# Patient Record
Sex: Female | Born: 1980 | Race: White | Hispanic: No | Marital: Single | Smoking: Current every day smoker
Health system: Southern US, Community
[De-identification: ages and names within clinical notes are randomized; demographics above are authoritative.]

## PROBLEM LIST (undated history)

## (undated) DIAGNOSIS — R569 Unspecified convulsions: Secondary | ICD-10-CM

## (undated) HISTORY — PX: TONSILLECTOMY: SUR1361

---

## 2004-02-15 ENCOUNTER — Emergency Department: Payer: Self-pay | Admitting: Emergency Medicine

## 2004-07-10 ENCOUNTER — Emergency Department: Payer: Self-pay | Admitting: Unknown Physician Specialty

## 2005-06-20 ENCOUNTER — Emergency Department: Payer: Self-pay | Admitting: Emergency Medicine

## 2005-08-28 ENCOUNTER — Ambulatory Visit: Payer: Self-pay | Admitting: Unknown Physician Specialty

## 2005-12-13 ENCOUNTER — Ambulatory Visit: Payer: Self-pay

## 2005-12-17 ENCOUNTER — Ambulatory Visit: Payer: Self-pay

## 2008-08-20 ENCOUNTER — Emergency Department: Payer: Self-pay | Admitting: Emergency Medicine

## 2011-12-04 ENCOUNTER — Emergency Department: Payer: Self-pay | Admitting: Emergency Medicine

## 2011-12-04 LAB — COMPREHENSIVE METABOLIC PANEL
Alkaline Phosphatase: 75 U/L (ref 50–136)
Anion Gap: 8 (ref 7–16)
BUN: 16 mg/dL (ref 7–18)
Bilirubin,Total: 0.1 mg/dL — ABNORMAL LOW (ref 0.2–1.0)
Calcium, Total: 8.7 mg/dL (ref 8.5–10.1)
Creatinine: 0.73 mg/dL (ref 0.60–1.30)
EGFR (African American): >60
EGFR (Non-African Amer.): >60
Glucose: 87 mg/dL (ref 65–99)
SGPT (ALT): 20 U/L (ref 12–78)
Sodium: 138 mmol/L (ref 136–145)
Total Protein: 7.3 g/dL (ref 6.4–8.2)

## 2011-12-04 LAB — CBC
MCV: 95 fL (ref 80–100)
Platelet: 239 10*3/uL (ref 150–440)
RDW: 13 % (ref 11.5–14.5)
WBC: 10 10*3/uL (ref 3.6–11.0)

## 2011-12-04 LAB — URINALYSIS, COMPLETE
Bilirubin,UR: NEGATIVE
Ketone: NEGATIVE
Ph: 6 (ref 4.5–8.0)
Protein: NEGATIVE
RBC,UR: 2 /HPF (ref 0–5)
Squamous Epithelial: <1

## 2011-12-04 LAB — PREGNANCY, URINE: Pregnancy Test, Urine: POSITIVE m[IU]/mL

## 2011-12-09 ENCOUNTER — Emergency Department: Payer: Self-pay | Admitting: Emergency Medicine

## 2011-12-09 LAB — URINALYSIS, COMPLETE
Bilirubin,UR: NEGATIVE
Ketone: NEGATIVE
Leukocyte Esterase: NEGATIVE
Nitrite: NEGATIVE
Ph: 5 (ref 4.5–8.0)
Protein: 30
RBC,UR: 3 /HPF (ref 0–5)
Specific Gravity: 1.025 (ref 1.003–1.030)
Squamous Epithelial: 2

## 2011-12-09 LAB — COMPREHENSIVE METABOLIC PANEL
Albumin: 3.6 g/dL (ref 3.4–5.0)
BUN: 13 mg/dL (ref 7–18)
Co2: 24 mmol/L (ref 21–32)
Creatinine: 0.73 mg/dL (ref 0.60–1.30)
EGFR (Non-African Amer.): >60
Glucose: 109 mg/dL — ABNORMAL HIGH (ref 65–99)
Potassium: 3.5 mmol/L (ref 3.5–5.1)
Sodium: 135 mmol/L — ABNORMAL LOW (ref 136–145)
Total Protein: 7.4 g/dL (ref 6.4–8.2)

## 2011-12-09 LAB — CBC
MCHC: 34.8 g/dL (ref 32.0–36.0)
Platelet: 239 10*3/uL (ref 150–440)
RDW: 13.1 % (ref 11.5–14.5)
WBC: 9.8 10*3/uL (ref 3.6–11.0)

## 2011-12-09 LAB — HCG, QUANTITATIVE, PREGNANCY: Beta Hcg, Quant.: 10015 m[IU]/mL — ABNORMAL HIGH

## 2011-12-09 LAB — LIPASE, BLOOD: Lipase: 127 U/L (ref 73–393)

## 2011-12-09 IMAGING — US US OB < 14 WEEKS - US OB TV
1 series · 14 of 28 positions shown · non-contrast
Comparison: none

REASON FOR EXAM: preg pt has epigastric pain but just told med has been
spotting x 2 days as well
COMMENTS:

[Series 1: us ob < 14 weeks - us ob tv · 0.23mm/px · 14 of 36 slices shown]
[im 2/36]
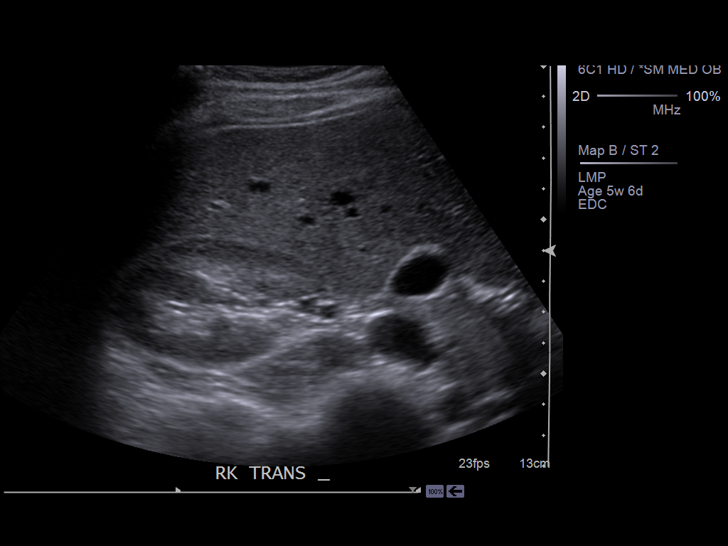
[im 4/36]
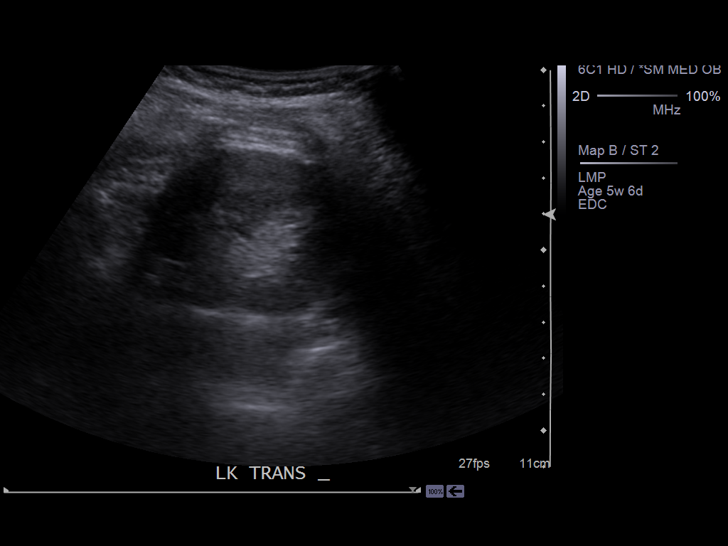
[im 7/36]
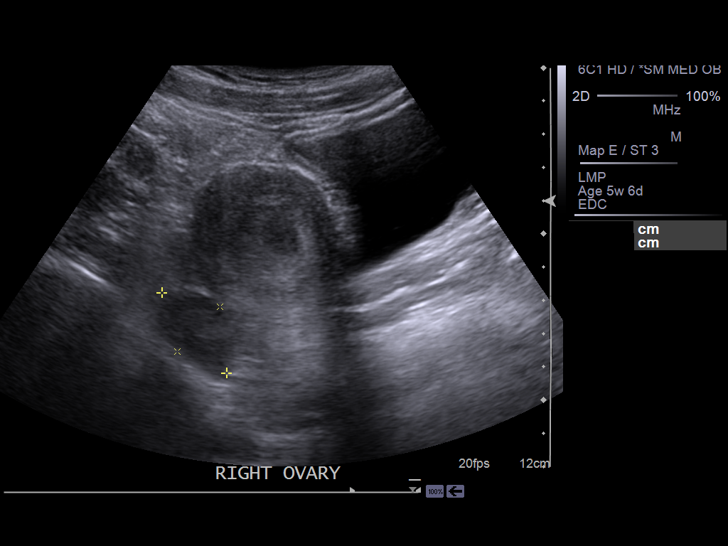
[im 10/36]
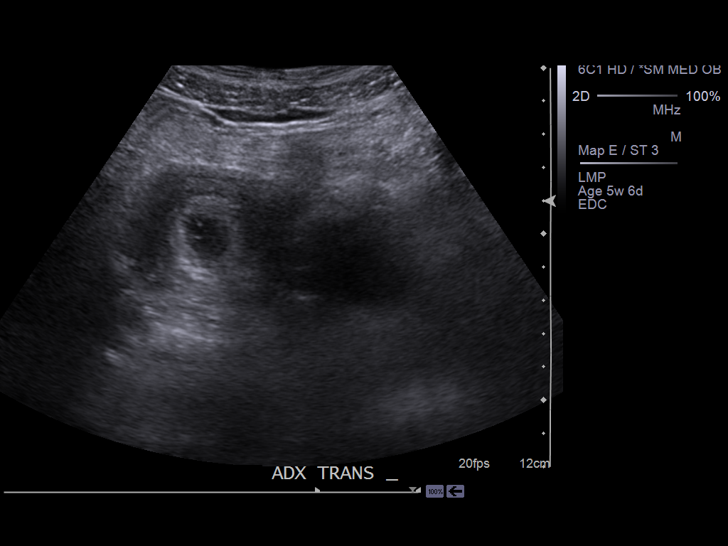
[im 12/36]
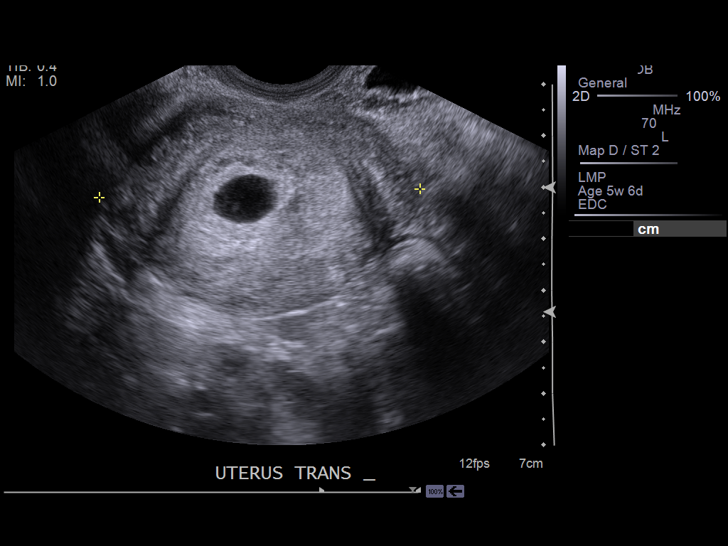
[im 15/36]
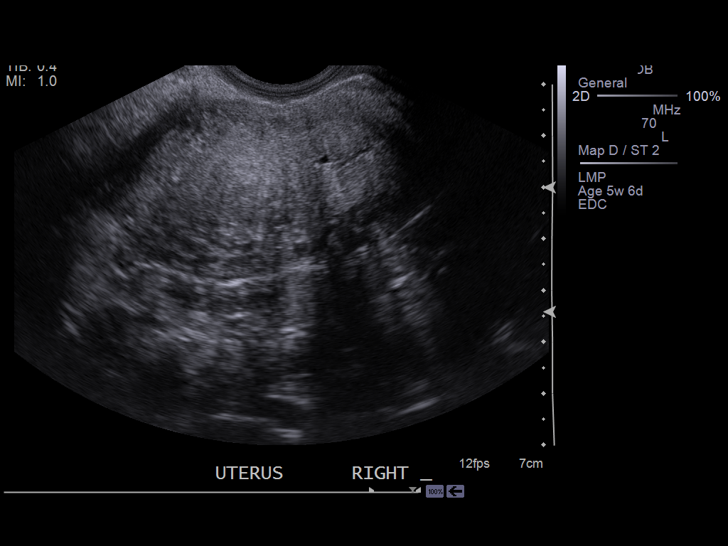
[im 17/36]
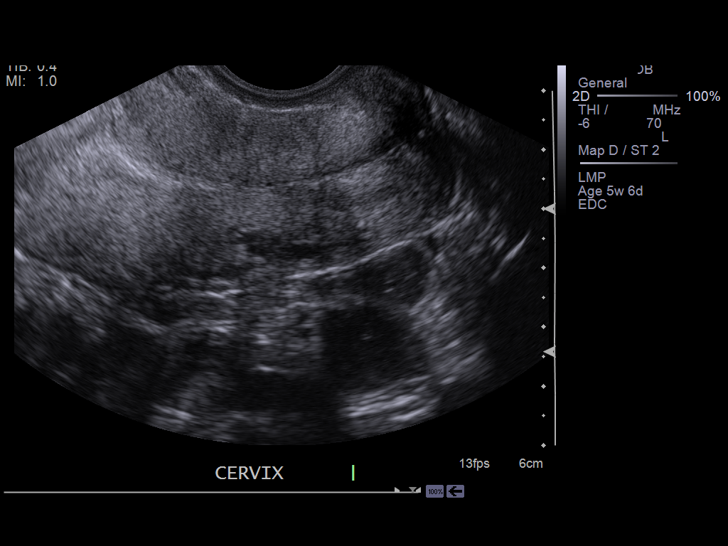
[im 20/36]
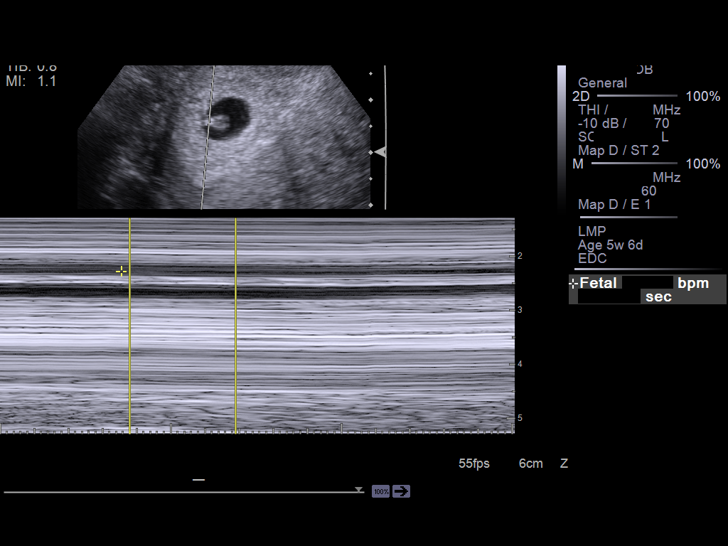
[im 23/36]
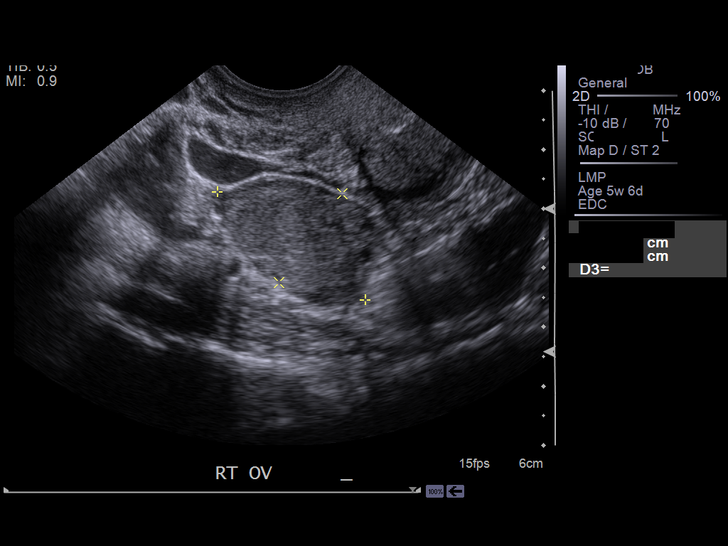
[im 25/36]
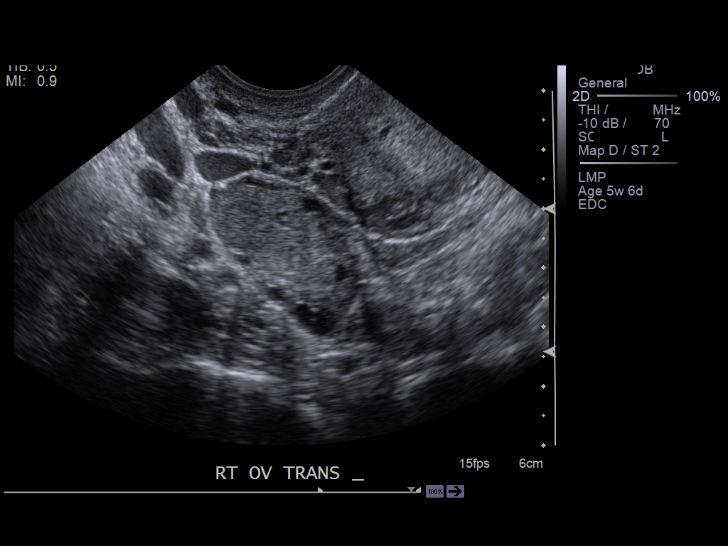
[im 28/36]
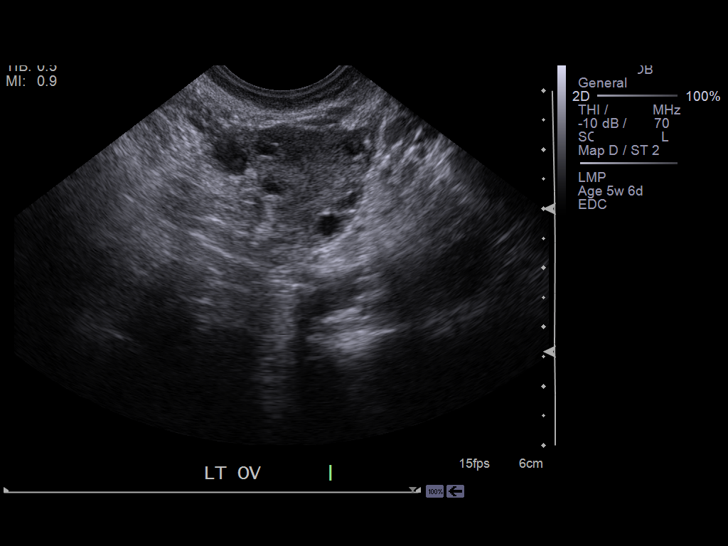
[im 30/36]
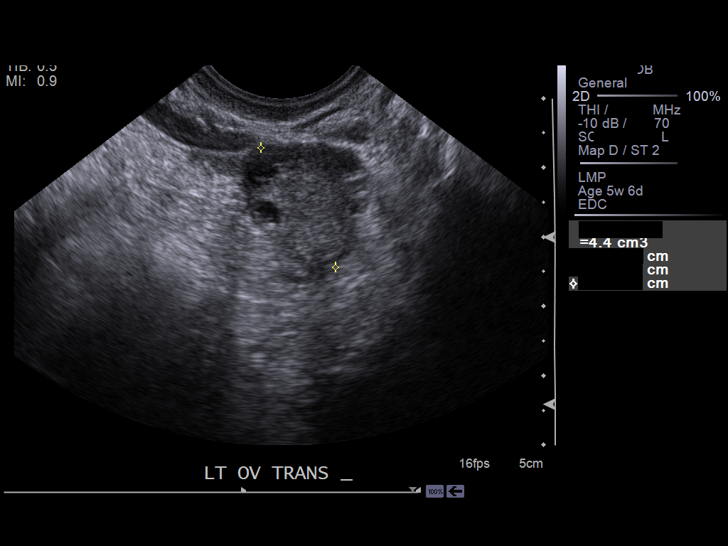
[im 33/36]
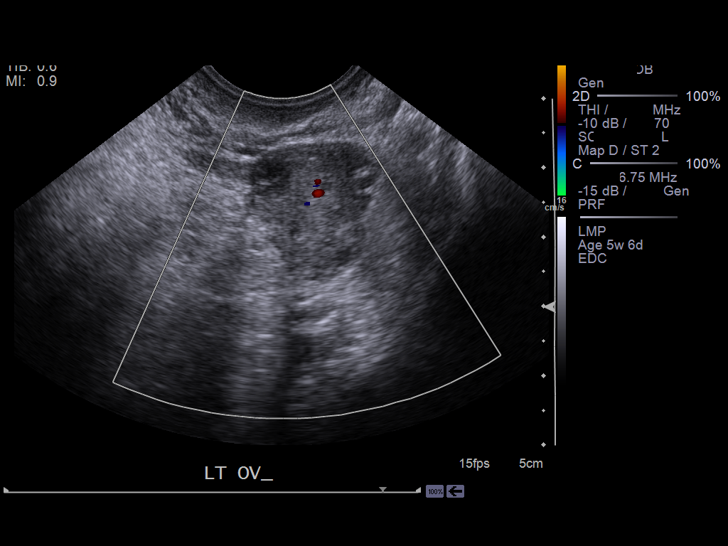
[im 36/36]
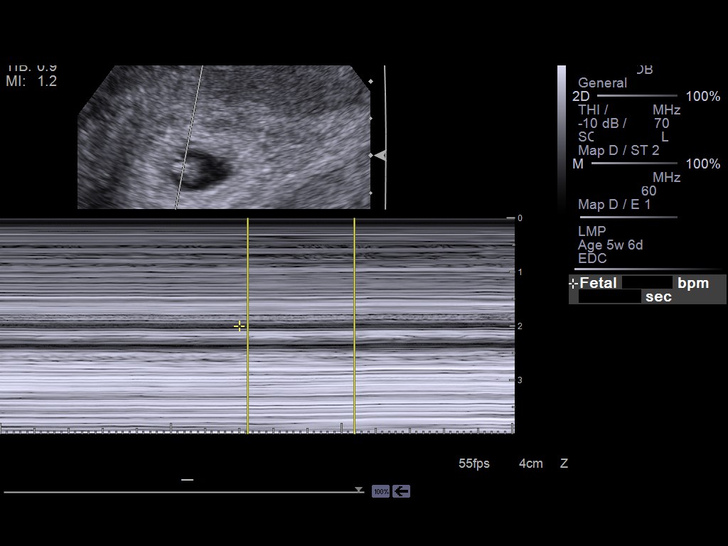

[14 of 28 positions shown; findings below may reference images not displayed]

PROCEDURE:     US  - US OB LESS THAN 14 WEEKS/W TRANS  - [DATE] [DATE]

RESULT:     The patient has a beta-hCG of [DATE]. The uterus exhibits a
gestational sac with a fetal pole exhibiting crown-rump length of 0.3 cm
corresponding to a 5 week 6 day gestation. A fetal cardiac rate of only 96
beats per minute was demonstrated. The maternal ovaries are normal in
echotexture and contour. The right ovary measures 3.1 cm in greatest
dimension and the left ovary 2.4 cm in greatest dimension. Survey views of
the maternal kidneys are normal.
IMPRESSION: There is an early IUP with a somewhat low cardiac rate of
only 96 beats per minute. The estimated gestational age is 5 weeks 6 days
+ / - 4 days. This corresponds to an estimated date of confinement [DATE]. This is in good agreement with the patient's clinical dating.

Followup ultrasound examinations are recommended to reassess the cardiac
rate as well as interval growth.

A preliminary report was sent to the [HOSPITAL] the conclusion
of the study.

[REDACTED]

## 2012-04-15 ENCOUNTER — Observation Stay: Payer: Self-pay | Admitting: Obstetrics & Gynecology

## 2012-04-15 LAB — URINALYSIS, COMPLETE
Bacteria: NONE SEEN
Bilirubin,UR: NEGATIVE
Glucose,UR: NEGATIVE mg/dL (ref 0–75)
Ketone: NEGATIVE
Nitrite: NEGATIVE
Ph: 7 (ref 4.5–8.0)
Protein: NEGATIVE
RBC,UR: 1 /HPF (ref 0–5)

## 2012-05-30 ENCOUNTER — Observation Stay: Payer: Self-pay | Admitting: Obstetrics & Gynecology

## 2012-06-30 ENCOUNTER — Observation Stay: Payer: Self-pay | Admitting: Obstetrics and Gynecology

## 2012-06-30 LAB — URINALYSIS, COMPLETE
Bilirubin,UR: NEGATIVE
Glucose,UR: NEGATIVE mg/dL (ref 0–75)
Ketone: NEGATIVE
Leukocyte Esterase: NEGATIVE
Nitrite: NEGATIVE
Ph: 7 (ref 4.5–8.0)
Protein: NEGATIVE
RBC,UR: <1 /HPF (ref 0–5)
Specific Gravity: 1.004 (ref 1.003–1.030)
Squamous Epithelial: <1

## 2012-07-10 ENCOUNTER — Observation Stay: Payer: Self-pay | Admitting: Obstetrics & Gynecology

## 2012-07-11 ENCOUNTER — Inpatient Hospital Stay: Payer: Self-pay

## 2012-07-11 LAB — URINALYSIS, COMPLETE
Bacteria: NONE SEEN
Bilirubin,UR: NEGATIVE
Nitrite: NEGATIVE
Ph: 7 (ref 4.5–8.0)
RBC,UR: <1 /HPF (ref 0–5)
Specific Gravity: 1.009 (ref 1.003–1.030)
Squamous Epithelial: <1

## 2012-07-11 LAB — DRUG SCREEN, URINE
Amphetamines, Ur Screen: NEGATIVE (ref ?–1000)
Cannabinoid 50 Ng, Ur ~~LOC~~: NEGATIVE (ref ?–50)
Cocaine Metabolite,Ur ~~LOC~~: NEGATIVE (ref ?–300)
Opiate, Ur Screen: NEGATIVE (ref ?–300)
Phencyclidine (PCP) Ur S: NEGATIVE (ref ?–25)
Tricyclic, Ur Screen: NEGATIVE (ref ?–1000)

## 2012-07-11 LAB — CBC WITH DIFFERENTIAL/PLATELET
Basophil #: 0.1 10*3/uL (ref 0.0–0.1)
Eosinophil #: 0.1 10*3/uL (ref 0.0–0.7)
Eosinophil %: 0.3 %
MCH: 30.1 pg (ref 26.0–34.0)
MCV: 93 fL (ref 80–100)
Monocyte #: 1.8 x10 3/mm — ABNORMAL HIGH (ref 0.2–0.9)
Neutrophil %: 87.4 %
RBC: 2.8 10*6/uL — ABNORMAL LOW (ref 3.80–5.20)
RDW: 13 % (ref 11.5–14.5)
WBC: 31.8 10*3/uL — ABNORMAL HIGH (ref 3.6–11.0)

## 2012-07-12 LAB — CBC WITH DIFFERENTIAL/PLATELET
Eosinophil %: 0.3 %
HGB: 8.6 g/dL — ABNORMAL LOW (ref 12.0–16.0)
MCH: 31.7 pg (ref 26.0–34.0)
MCHC: 34.3 g/dL (ref 32.0–36.0)
MCV: 92 fL (ref 80–100)
Monocyte #: 3.1 x10 3/mm — ABNORMAL HIGH (ref 0.2–0.9)
Neutrophil #: 35.5 10*3/uL — ABNORMAL HIGH (ref 1.4–6.5)
Neutrophil %: 83.8 %
RBC: 2.71 10*6/uL — ABNORMAL LOW (ref 3.80–5.20)
WBC: 42 10*3/uL — ABNORMAL HIGH (ref 3.6–11.0)

## 2012-07-12 LAB — BASIC METABOLIC PANEL
Anion Gap: 11 (ref 7–16)
Chloride: 106 mmol/L (ref 98–107)
Co2: 22 mmol/L (ref 21–32)
EGFR (African American): >60
EGFR (Non-African Amer.): >60
Glucose: 74 mg/dL (ref 65–99)
Osmolality: 273 (ref 275–301)
Potassium: 3.2 mmol/L — ABNORMAL LOW (ref 3.5–5.1)

## 2012-07-12 LAB — GENTAMICIN LEVEL, RANDOM: Gentamicin, Random: 5.8 ug/mL

## 2012-07-13 LAB — CBC WITH DIFFERENTIAL/PLATELET
Basophil #: 0.1 10*3/uL (ref 0.0–0.1)
Basophil %: 0.3 %
Eosinophil #: 0.4 10*3/uL (ref 0.0–0.7)
Eosinophil %: 1.8 %
HCT: 20.9 % — ABNORMAL LOW (ref 35.0–47.0)
HGB: 7.2 g/dL — ABNORMAL LOW (ref 12.0–16.0)
Lymphocyte #: 4.1 10*3/uL — ABNORMAL HIGH (ref 1.0–3.6)
Lymphocyte %: 20.4 %
MCH: 32.1 pg (ref 26.0–34.0)
MCHC: 34.3 g/dL (ref 32.0–36.0)
MCV: 94 fL (ref 80–100)
Monocyte #: 1.3 x10 3/mm — ABNORMAL HIGH (ref 0.2–0.9)
Neutrophil #: 14.3 10*3/uL — ABNORMAL HIGH (ref 1.4–6.5)
Neutrophil %: 71 %
Platelet: 187 10*3/uL (ref 150–440)
RDW: 13.7 % (ref 11.5–14.5)
WBC: 20.1 10*3/uL — ABNORMAL HIGH (ref 3.6–11.0)

## 2012-07-13 LAB — URINE CULTURE

## 2012-07-14 LAB — CBC WITH DIFFERENTIAL/PLATELET
Eosinophil #: 0.3 10*3/uL (ref 0.0–0.7)
HCT: 23 % — ABNORMAL LOW (ref 35.0–47.0)
HGB: 7.9 g/dL — ABNORMAL LOW (ref 12.0–16.0)
Lymphocyte #: 3.4 10*3/uL (ref 1.0–3.6)
MCV: 94 fL (ref 80–100)
Monocyte #: 1 x10 3/mm — ABNORMAL HIGH (ref 0.2–0.9)
Neutrophil #: 10.1 10*3/uL — ABNORMAL HIGH (ref 1.4–6.5)
Platelet: 225 10*3/uL (ref 150–440)
RBC: 2.45 10*6/uL — ABNORMAL LOW (ref 3.80–5.20)
RDW: 13.3 % (ref 11.5–14.5)
WBC: 14.9 10*3/uL — ABNORMAL HIGH (ref 3.6–11.0)

## 2012-07-14 LAB — CREATININE, SERUM: EGFR (African American): >60

## 2012-07-15 LAB — CBC WITH DIFFERENTIAL/PLATELET
Eosinophil #: 0.3 10*3/uL (ref 0.0–0.7)
Eosinophil %: 1.8 %
HCT: 25.6 % — ABNORMAL LOW (ref 35.0–47.0)
Lymphocyte %: 22.3 %
MCH: 32.5 pg (ref 26.0–34.0)
MCV: 94 fL (ref 80–100)
Monocyte #: 1.1 x10 3/mm — ABNORMAL HIGH (ref 0.2–0.9)
Monocyte %: 7.5 %
Neutrophil %: 67.9 %
Platelet: 269 10*3/uL (ref 150–440)
RDW: 13.4 % (ref 11.5–14.5)

## 2012-07-15 LAB — CREATININE, SERUM: EGFR (African American): >60

## 2012-07-15 LAB — POTASSIUM: Potassium: 4.8 mmol/L (ref 3.5–5.1)

## 2012-07-17 LAB — MISC AER/ANAEROBIC CULT.

## 2013-06-25 ENCOUNTER — Emergency Department: Payer: Self-pay | Admitting: Emergency Medicine

## 2013-06-25 LAB — CBC
HCT: 42.7 % (ref 35.0–47.0)
HGB: 13.7 g/dL (ref 12.0–16.0)
MCH: 30.2 pg (ref 26.0–34.0)
MCHC: 32.1 g/dL (ref 32.0–36.0)
MCV: 94 fL (ref 80–100)
PLATELETS: 185 10*3/uL (ref 150–440)
RBC: 4.54 10*6/uL (ref 3.80–5.20)
RDW: 14.8 % — ABNORMAL HIGH (ref 11.5–14.5)
WBC: 7.2 10*3/uL (ref 3.6–11.0)

## 2013-06-25 LAB — BASIC METABOLIC PANEL
Anion Gap: 7 (ref 7–16)
BUN: 9 mg/dL (ref 7–18)
Calcium, Total: 8 mg/dL — ABNORMAL LOW (ref 8.5–10.1)
Chloride: 111 mmol/L — ABNORMAL HIGH (ref 98–107)
Co2: 22 mmol/L (ref 21–32)
Creatinine: 0.74 mg/dL (ref 0.60–1.30)
EGFR (African American): >60
EGFR (Non-African Amer.): >60
Glucose: 104 mg/dL — ABNORMAL HIGH (ref 65–99)
Osmolality: 278 (ref 275–301)
Potassium: 3.8 mmol/L (ref 3.5–5.1)
Sodium: 140 mmol/L (ref 136–145)

## 2014-04-28 ENCOUNTER — Emergency Department: Admit: 2014-04-28 | Disposition: A | Discharge status: Home / Self Care | Payer: Self-pay | Admitting: Student

## 2014-05-18 NOTE — H&P (Signed)
L&D Evaluation:  History:  HPI 34 yo [id number] @ 34.6wks EDC 08/04/12 by LMP presents from the office with ctxs.  She reports ctxs beginning before her appt today.  Cvx 2cm on exam.  Preg c/b prior preterm delivery @36wks  x 2.  Denies LOF, VB.   Presents with contractions   Patient's Medical History Anxiety   Patient's Surgical History D&C  Tonsillectomy   Medications Pre Natal Vitamins   Allergies PCN   Social History tobacco   Family History Htn   ROS:  ROS See HPI, all others reviewed and neg   Exam:  Vital Signs stable   General no apparent distress   Mental Status clear   Chest clear   Heart normal sinus rhythm   Abdomen gravid, non-tender   Edema 1+   Pelvic 1/25/ballotable   Mebranes Intact   FHT normal rate with no decels   Fetal Heart Rate 125   Ucx q 8 min on admission, now q 30 min   Skin dry   Impression:  Impression Preterm contractions, 34.6wks   Plan:  Plan UA, EFM/NST   Comments Cervix essentially unchanged, actually not as dilated, from office exam.  Disc labor precautions and POC with pt.  Awaiting UA.  If neg, will d/c home.  Have her f/u in the next day or 2.  Return to L&D with more frequent contractions, LOF, VB, decr FM.   Follow Up Appointment need to schedule   Electronic Signatures: Senaida LangeWeaver-Lee, Tamkia Temples (MD)  (Signed 23-Jun-14 19:47)  Authored: L&D Evaluation   Last Updated: 23-Jun-14 19:47 by Senaida LangeWeaver-Lee, Lindy Garczynski (MD)

## 2014-05-18 NOTE — H&P (Signed)
L&D Evaluation:  History:  HPI 17XW [id number] G5P1213 at 24wks presents from the office with c/o RLQ pain present for the past day.  In office today cervical exam checked and found to be closed but sent over for further evaluation.  Pain is colicky in nature and is located primarily right flank and RLQ.  Pain improves with knee chest positioning.  No contractions, VB, LOF.  Good FM.  No dysuria, hematuria, F/C, N/V, diarrhea or constipation.   Presents with abdominal pain   Patient's Medical History anxiety   Patient's Surgical History D&C  tonsillectomy   Medications Pre Natal Vitamins   Allergies PCN   Social History tobacco   Family History Non-Contributory   ROS:  ROS see HPI   Exam:  Vital Signs stable   General mild distress with colicky type pain, which causes her to curl up in bed   Mental Status clear   Chest normal effort   Abdomen gravid, non-tender, no rebound or guarding   Fundal Height c/w 24wks   Back no CVAT   Pelvic cervix closed and thick, and unchanged over 4 hours   Mebranes Intact   FHT normal rate with no decels   Ucx absent   Skin dry   Other UA only with micro hematuria renal U/S neg for calcified stone/hydronephrosis   Impression:  Impression 34yo [id number] at 24wks with likely right nephrolithiasis unable to be visualized on U/S.   Plan:  Comments - no signs of PTL at this time, cervix remains closed over multiple hours - reviewed likelihood of nephrolithiasis and conservative treatment including PO hydration and pain medication, rx percocet given - strict precautions given, if any worsening in symptoms or development of F/C, N/V pt to RTO sooner than scheduled next week - reviewed PTL precautions   Follow Up Appointment already scheduled. in 1 week   Electronic Signatures: Garnette GunnerStansbury Clipp, Ali LoweEryn K (MD)  (Signed 08-Apr-14 15:53)  Authored: L&D Evaluation   Last Updated: 08-Apr-14 15:53 by Garnette GunnerStansbury Clipp, Ali LoweEryn K (MD)

## 2014-05-18 NOTE — H&P (Signed)
L&D Evaluation:  History Expanded:  HPI 17XW [id number] G5P1213 at 30wks presents from the office with c/o RLQ pain present for the past day.  In office today cervical exam checked and found to be closed but sent over for further evaluation.  Pain is colicky in nature and is located primarily  RLQ.   No contractions, VB, LOF.  Good FM.  No dysuria, hematuria, F/C, N/V, diarrhea or constipation.   Presents with abdominal pain   Patient's Medical History anxiety   Patient's Surgical History D&C  tonsillectomy   Medications Pre Natal Vitamins   Allergies PCN   Social History tobacco   Family History Non-Contributory   ROS:  ROS see HPI   Exam:  Vital Signs stable   General no apparent distress   Mental Status clear   Chest normal effort   Heart normal sinus rhythm   Abdomen gravid, non-tender, no rebound or guarding   Estimated Fetal Weight Average for gestational age   Back no CVAT   Edema no edema   Pelvic no external lesions, cervix closed and thick   Mebranes Intact   FHT normal rate with no decels   Fetal Heart Rate 140   Ucx absent   Skin dry   Impression:  Impression 34yo [id number] at 30wks with likely round lig pain   Plan:  Plan monitor contractions and for cervical change   Comments - no signs of PTL at this time, cervix remains closed over multiple hours - fetal fibronectin pending - rest, fluids, heat, tylenol.   Follow Up Appointment already scheduled   Electronic Signatures: Letitia LibraHarris, Bryn Saline Paul (MD)  (Signed 23-May-14 15:44)  Authored: L&D Evaluation   Last Updated: 23-May-14 15:44 by Letitia LibraHarris, Taysen Bushart Paul (MD)

## 2014-05-18 NOTE — H&P (Signed)
L&D Evaluation:  History Expanded:  HPI 34 yo [id number] WF at 36 5/[redacted] weeks EGA w bloody show, mild contractions.  No ROM.  [facility]. Prior PTD at 36 weeks.  No compl this preg.   Patient's Medical History No Chronic Illness   Patient's Surgical History none   Medications Pre Natal Vitamins   Allergies NKDA   Social History tobacco   Family History Non-Contributory   ROS:  ROS All systems were reviewed.  HEENT, CNS, GI, GU, Respiratory, CV, Renal and Musculoskeletal systems were found to be normal.   Exam:  Vital Signs stable   General no apparent distress   Mental Status clear   Abdomen gravid, non-tender   Estimated Fetal Weight Average for gestational age   Back no CVAT   Edema 1+   Pelvic no external lesions, 1/60/-4   Mebranes Intact   FHT normal rate with no decels   Ucx rare   Impression:  Impression Bloody show, false vs early labor   Plan:  Plan EFM/NST, monitor contractions and for cervical change, fluids   Electronic Signatures: Letitia LibraHarris, Delsie Amador Paul (MD)  (Signed 03-Jul-14 23:03)  Authored: L&D Evaluation   Last Updated: 03-Jul-14 23:03 by Letitia LibraHarris, Marlicia Sroka Paul (MD)

## 2014-05-18 NOTE — H&P (Signed)
L&D Evaluation:  History:  HPI 34 yo G5 P1213 at 7543w4d gestational age by early ultrasound.  She presents with regular uterine contractions since last night that have worsened in severity.  She notes positive fetal movement, denies leakage of fluid and vaginal bleeding.  She denies fevers, chills, nausea, emesis, GI symptoms, urinary symptoms, vaginal symptoms.  Her pregnancy has been complicated by a history of two previous preterm deliveries (one month prior to EDD).  She also had an LSIL pap smear during her pregnancy.  She had a negative second trimester screen (TETRA).  Blood type is A+ / RI / VI / GBS negative / s/p TDaP on 05/26/12.   Patient's Medical History 1) Anxiety , 2) seizures   Patient's Surgical History D&C  Tonsillectomy   Medications Pre Natal Vitamins   Allergies PCN   Social History tobacco   Family History Non-Contributory   History Expanded:  Blood Type (Maternal) A positive   Group B Strep Results Maternal (Result >5wks must be treated as unknown) negative   Maternal HIV Negative   Maternal Syphilis Ab Nonreactive   Maternal Varicella Immune   Rubella Results (Maternal) immune   Exam:  Vital Signs stable  T 98.6, P 88, RR 16, BP 124/70,   Urine Protein negative dipstick   General mild distress with contractions   Chest clear   Heart normal sinus rhythm   Abdomen gravid, tender with contractions   Estimated Fetal Weight Average for gestational age   Fetal Position vtx   Back no CVAT   Edema no edema   Pelvic no external lesions, 2 cm initially, now 3.5cm   Mebranes Intact   FHT normal rate with no decels   FHT Description 130/mod var/+ accels/ no decels   Ucx 3-4 q 10 min   Skin no lesions   Impression:  Impression early labor, reactive NST   Plan:  Plan UA, EFM/NST, monitor contractions and for cervical change, monitor BP, fluids   Comments 1) Preterm Labor:  patient is contracting painfully and has had cervical change.   A single dose of terbutaline was attempted without success.  She has been given IV fluids, as well.  Will continue to support with IV pain medication for now. Though, I'm concerned  that she will progress quickly once she is in active labor.    2) Pain associated with contractions:  She has had two doses of fentanyl (50mcg) and one dilaudid (0.5mg ) in addition to terbutaline.  None of these have helped her with her pain, per her report.  On CBC her WBC count is 31.  This has been discussed with anesthesia and they will not give her an epidural given this WBC count.  Will give her fentanyl 100mcg now to see if this dose gives her relief or at least some improvement in her pain.  She has asked me to "cut my baby out."  This has occured multiple times. In no uncertain terms have I told her that I would not just perform a cesarean section nor would I commit her to the labor process at 36 weeks.    3) Elevated WBC count: no source is obvoius.  She denies symtpoms of any sort apart from contraction-related symptoms.  Her abdomen is not tender without contractions.  Chorioamnionitis would be among my differential, but she is afebrile, not tachycardic outside of the influence of terbutaline, the fetus is also not tachycardic. Her urine is not suspicious for infection, either.  Her lungs are  clear and she has no respiratory symptoms.  No skin lesions noted.   Will continue to monitor closely.   Labs:  Lab Results:  Urine Drugs:  04-Jul-14 12:11   Tricyclic Antidepressant, Ur Qual (comp) NEGATIVE (Result(s) reported on 11 Jul 2012 at 03:26PM.)  Amphetamines, Urine Qual. NEGATIVE  MDMA, Urine Qual. NEGATIVE  Cocaine Metabolite, Urine Qual. NEGATIVE  Opiate, Urine qual NEGATIVE  Phencyclidine, Urine Qual. NEGATIVE  Cannabinoid, Urine Qual. NEGATIVE  Barbiturates, Urine Qual. NEGATIVE  Benzodiazepine, Urine Qual. NEGATIVE (----------------- The URINE DRUG SCREEN provides only a preliminary,  unconfirmed analytical test result and should not be used for non-medical  purposes.  Clinical consideration and professional judgment should be  applied to any positive drug screen result due to possible interfering substances.  A more specific alternate chemical method must be used in order to obtain a confirmed analytical result.  Gas chromatography/mass spectrometry (GC/MS) is the preferred confirmatory method.)  Methadone, Urine Qual. NEGATIVE  Routine UA:  04-Jul-14 12:11   Color (UA) Yellow  Clarity (UA) Clear  Glucose (UA) Negative  Bilirubin (UA) Negative  Ketones (UA) Trace  Specific Gravity (UA) 1.009  Blood (UA) Negative  pH (UA) 7.0  Protein (UA) Negative  Nitrite (UA) Negative  Leukocyte Esterase (UA) Trace (Result(s) reported on 11 Jul 2012 at 03:12PM.)  RBC (UA) <1 /HPF  WBC (UA) 9 /HPF  Bacteria (UA) NONE SEEN  Epithelial Cells (UA) <1 /HPF  Mucous (UA) PRESENT (Result(s) reported on 11 Jul 2012 at 03:12PM.)  Routine Hem:  04-Jul-14 16:40   WBC (CBC)  31.8  RBC (CBC)  2.80  Hemoglobin (CBC)  8.4  Hematocrit (CBC)  26.1  Platelet Count (CBC) 231  MCV 93  MCH 30.1  MCHC 32.3  RDW 13.0  Neutrophil % 87.4  Lymphocyte % 6.3  Monocyte % 5.6  Eosinophil % 0.3  Basophil % 0.4  Neutrophil #  27.8  Lymphocyte # 2.0  Monocyte #  1.8  Eosinophil # 0.1  Basophil # 0.1 (Result(s) reported on 11 Jul 2012 at 04:56PM.)   Electronic Signatures: Conard NovakJackson, Chevelle Durr D (MD)  (Signed 04-Jul-14 18:18)  Authored: L&D Evaluation, Labs   Last Updated: 04-Jul-14 18:18 by Conard NovakJackson, Jany Buckwalter D (MD)

## 2016-01-24 ENCOUNTER — Emergency Department
Admission: EM | Admit: 2016-01-24 | Discharge: 2016-01-24 | Disposition: A | Discharge status: Home / Self Care | Payer: Self-pay | Attending: Emergency Medicine | Admitting: Emergency Medicine

## 2016-01-24 ENCOUNTER — Emergency Department: Payer: Self-pay

## 2016-01-24 DIAGNOSIS — Y929 Unspecified place or not applicable: Secondary | ICD-10-CM | POA: Insufficient documentation

## 2016-01-24 DIAGNOSIS — S6991XA Unspecified injury of right wrist, hand and finger(s), initial encounter: Secondary | ICD-10-CM

## 2016-01-24 DIAGNOSIS — S67194A Crushing injury of right ring finger, initial encounter: Secondary | ICD-10-CM | POA: Insufficient documentation

## 2016-01-24 DIAGNOSIS — Y999 Unspecified external cause status: Secondary | ICD-10-CM | POA: Insufficient documentation

## 2016-01-24 DIAGNOSIS — W228XXA Striking against or struck by other objects, initial encounter: Secondary | ICD-10-CM | POA: Insufficient documentation

## 2016-01-24 DIAGNOSIS — Y9389 Activity, other specified: Secondary | ICD-10-CM | POA: Insufficient documentation

## 2016-01-24 MED ORDER — MELOXICAM 15 MG PO TABS: 15.0000 mg | ORAL_TABLET | Freq: Every day | ORAL | 0 refills | Status: DC

## 2016-01-24 NOTE — ED Triage Notes (Signed)
Pt states that she was chopping wood and is not sure what happened, but later she hit the R ringer finger and noticed it was painful and swollen.  No obvious cut noted, but finger swollen at this time.  Pt A&Ox4 and in NAD.

## 2016-01-24 NOTE — ED Provider Notes (Signed)
[facility] Emergency Department Provider Note  ____________________________________________  Time seen: Approximately 8:34 PM  I have reviewed the triage vital signs and the nursing notes.   HISTORY  Chief Complaint Finger Injury    HPI Brianna Holmes is a 36 y.o. female who presents emergency department complaining of pain to the fourth digit of the right hand. Patient states that she was stacking wood and she must have accidentally hit her hand. She does not remember the definitive injury. Patient reports that she come back inside she bumped her hand on the door and noticed swelling to the digit. Patient reports swelling to the PIP joint. She denies any other injury or complaint. No medications prior to arrival.   History reviewed. No pertinent past medical history.  There are no active problems to display for this patient.   History reviewed. No pertinent surgical history.  Prior to Admission medications   Not on File    Allergies Penicillins  No family history on file.  Social History Social History  Substance Use Topics  . Smoking status: Not on file  . Smokeless tobacco: Not on file  . Alcohol use Not on file     Review of Systems  Constitutional: No fever/chills Cardiovascular: no chest pain. Respiratory: no cough. No SOB. Musculoskeletal: Positive for pain and swelling to fourth digit of the right hand Skin: Negative for rash, abrasions, lacerations, ecchymosis. Neurological: Negative for headaches, focal weakness or numbness. 10-point ROS otherwise negative.  ____________________________________________   PHYSICAL EXAM:  VITAL SIGNS: ED Triage Vitals  Enc Vitals Group     BP 01/24/16 1917 122/74     Pulse Rate 01/24/16 1917 85     Resp 01/24/16 1917 18     Temp 01/24/16 1917 98.4 F (36.9 C)     Temp Source 01/24/16 1917 Oral     SpO2 01/24/16 1917 100 %     Weight 01/24/16 1918 135 lb (61.2 kg)     Height  01/24/16 1918 5\' 5"  (1.651 m)     Head Circumference --      Peak Flow --      Pain Score 01/24/16 1921 7     Pain Loc --      Pain Edu? --      Excl. in GC? --      Constitutional: Alert and oriented. Well appearing and in no acute distress. Eyes: Conjunctivae are normal. PERRL. EOMI. Head: Atraumatic. Neck: No stridor.    Cardiovascular: Normal rate, regular rhythm. Normal S1 and S2.  Good peripheral circulation. Respiratory: Normal respiratory effort without tachypnea or retractions. Lungs CTAB. Good air entry to the bases with no decreased or absent breath sounds. Musculoskeletal: Full range of motion to all extremities. No gross deformities appreciated.Patient has edema to the PIP joint fourth digit right hand. Patient is very tender to palpation of this joint. No tenderness to palpation. Limited extension and flexion likely due to swelling. Sensation and cap refill intact distally. Neurologic:  Normal speech and language. No gross focal neurologic deficits are appreciated.  Skin:  Skin is warm, dry and intact. No rash noted. Psychiatric: Mood and affect are normal. Speech and behavior are normal. Patient exhibits appropriate insight and judgement.   ____________________________________________   LABS (all labs ordered are listed, but only abnormal results are displayed)  Labs Reviewed - No data to display ____________________________________________  EKG   ____________________________________________  RADIOLOGY Festus BarrenI, Anastasia Tompson D Carinne Brandenburger, personally viewed and evaluated these images (plain radiographs) as  part of my medical decision making, as well as reviewing the written report by the radiologist.  Dg Finger Ring Right  Result Date: 01/24/2016 CLINICAL DATA:  Jammed right ring finger while picking up wood. Pain. EXAM: RIGHT RING FINGER 2+V COMPARISON:  None. FINDINGS: There is no evidence of fracture or dislocation. There is no evidence of arthropathy or other focal bone  abnormality. There is mild soft tissue swelling about the PIP joint. IMPRESSION: Mild soft tissue swelling of the right ring finger without acute osseous abnormality or malalignment. Electronically Signed   By: Tollie Eth M.D.   On: 01/24/2016 19:16    ____________________________________________    PROCEDURES  Procedure(s) performed:    .Splint Application Date/Time: 01/24/2016 8:55 PM Performed by: Gala Romney D Authorized by: Gala Romney D   Consent:    Consent obtained:  Verbal   Consent given by:  Patient   Risks discussed:  Pain and swelling Pre-procedure details:    Sensation:  Normal Procedure details:    Laterality:  Right   Location:  Finger   Finger:  R ring finger   Splint type:  Finger   Supplies:  Aluminum splint and elastic bandage Post-procedure details:    Pain:  Unchanged   Sensation:  Normal   Patient tolerance of procedure:  Tolerated well, no immediate complications      Medications - No data to display   ____________________________________________   INITIAL IMPRESSION / ASSESSMENT AND PLAN / ED COURSE  Pertinent labs & imaging results that were available during my care of the patient were reviewed by me and considered in my medical decision making (see chart for details).  Review of the [place] CSRS was performed in accordance of the [facility] prior to dispensing any controlled drugs.  Clinical Course     Patient's diagnosis is consistent with Jammed fourth digit to the right hand. X-ray reveals no acute osseous undermining. Patient did have limited extension and flexion this is likely due to swelling over the PIP joint. No definitive indication at this time of extensor or flexor tendon tear. However, due to the limited range of motion, finger is splinted and she will be referred to orthopedics for further follow-up to ensure no ligamentous injury.. Patient will be discharged home with prescriptions for anti-inflammatories for symptom  control. Patient is to follow up with orthopedics as needed or otherwise directed. Patient is given ED precautions to return to the ED for any worsening or new symptoms.     ____________________________________________  FINAL CLINICAL IMPRESSION(S) / ED DIAGNOSES  Final diagnoses:  None      NEW MEDICATIONS STARTED DURING THIS VISIT:  New Prescriptions   No medications on file        This chart was dictated using voice recognition software/Dragon. Despite best efforts to proofread, errors can occur which can change the meaning. Any change was purely unintentional.    Racheal Patches, PA-C 01/24/16 2101    Myrna Blazer, MD 01/25/16 0010

## 2016-11-25 ENCOUNTER — Emergency Department
Admission: EM | Admit: 2016-11-25 | Discharge: 2016-11-25 | Disposition: A | Discharge status: Home / Self Care | Payer: Self-pay | Attending: Emergency Medicine | Admitting: Emergency Medicine

## 2016-11-25 ENCOUNTER — Other Ambulatory Visit: Payer: Self-pay

## 2016-11-25 DIAGNOSIS — L02416 Cutaneous abscess of left lower limb: Secondary | ICD-10-CM | POA: Insufficient documentation

## 2016-11-25 DIAGNOSIS — L03116 Cellulitis of left lower limb: Secondary | ICD-10-CM | POA: Insufficient documentation

## 2016-11-25 DIAGNOSIS — L0291 Cutaneous abscess, unspecified: Secondary | ICD-10-CM

## 2016-11-25 DIAGNOSIS — F17210 Nicotine dependence, cigarettes, uncomplicated: Secondary | ICD-10-CM | POA: Insufficient documentation

## 2016-11-25 DIAGNOSIS — Z79899 Other long term (current) drug therapy: Secondary | ICD-10-CM | POA: Insufficient documentation

## 2016-11-25 DIAGNOSIS — Z23 Encounter for immunization: Secondary | ICD-10-CM | POA: Insufficient documentation

## 2016-11-25 HISTORY — DX: Unspecified convulsions: R56.9

## 2016-11-25 MED ORDER — LIDOCAINE HCL (PF) 1 % IJ SOLN
5.0000 mL | Freq: Once | INTRAMUSCULAR | Status: AC
  Administered 2016-11-25: 5 mL via INTRADERMAL
  Filled 2016-11-25: qty 5

## 2016-11-25 MED ORDER — TETANUS-DIPHTH-ACELL PERTUSSIS 5-2.5-18.5 LF-MCG/0.5 IM SUSP
0.5000 mL | Freq: Once | INTRAMUSCULAR | Status: AC
  Administered 2016-11-25: 0.5 mL via INTRAMUSCULAR
  Filled 2016-11-25: qty 0.5

## 2016-11-25 MED ORDER — CLINDAMYCIN PHOSPHATE 300 MG/2ML IJ SOLN
600.0000 mg | Freq: Once | INTRAMUSCULAR | Status: AC
  Administered 2016-11-25: 600 mg via INTRAMUSCULAR
  Filled 2016-11-25: qty 4

## 2016-11-25 MED ORDER — CLINDAMYCIN HCL 300 MG PO CAPS: 300.0000 mg | ORAL_CAPSULE | Freq: Four times a day (QID) | ORAL | 0 refills | Status: AC

## 2016-11-25 MED ORDER — IBUPROFEN 800 MG PO TABS
800.0000 mg | ORAL_TABLET | Freq: Once | ORAL | Status: AC
  Administered 2016-11-25: 800 mg via ORAL
  Filled 2016-11-25: qty 1

## 2016-11-25 MED ORDER — CLINDAMYCIN PHOSPHATE 600 MG/4ML IJ SOLN
600.0000 mg | Freq: Once | INTRAMUSCULAR | Status: DC
  Filled 2016-11-25: qty 4

## 2016-11-25 MED ORDER — CLINDAMYCIN PHOSPHATE 600 MG/4ML IJ SOLN: 600.0000 mg | Freq: Once | INTRAMUSCULAR | Status: DC

## 2016-11-25 NOTE — ED Triage Notes (Signed)
Pt states something bit her right upper leg about 3 days ago and now has redness and swelling with drainage.

## 2016-11-25 NOTE — ED Notes (Signed)
Patient reports insect bite upper lateral right thigh, darkest red swollen area measures 5 cm x 5 cm, reports first siting was three days ago    Patient reports pain 8/10 after taking 880 mg Ibuprofen at approximately 0930 Patient does have a wood pile and has retrieved wood.

## 2016-11-25 NOTE — ED Triage Notes (Signed)
First Nurse Note:  Arrives with c/o spider bite to Right Lower Leg x 2-3 days.

## 2016-11-25 NOTE — ED Provider Notes (Signed)
[facility] Emergency Department Provider Note  ____________________________________________  Time seen: Approximately 1:02 PM  I have reviewed the triage vital signs and the nursing notes.   HISTORY  Chief Complaint Insect Bite    HPI Collier FlowersJessica R Holmes is a 36 y.o. female that presents the emergency department for evaluation of area of redness, pain, swelling to left leg.  She states there is an area that looks like she was bitten by a bug.  She noticed it when she woke up in the morning.  Swelling, pain, redness has worsened.  No fever, chills, nausea, vomiting, abdominal pain  Past Medical History:  Diagnosis Date  . Seizures (HCC)     There are no active problems to display for this patient.   Past Surgical History:  Procedure Laterality Date  . TONSILLECTOMY      Prior to Admission medications   Medication Sig Start Date End Date Taking? Authorizing Provider  clindamycin (CLEOCIN) 300 MG capsule Take 1 capsule (300 mg total) 4 (four) times daily for 10 days by mouth. 11/25/16 12/05/16  Enid DerryWagner, Dustin Bumbaugh, PA-C  meloxicam (MOBIC) 15 MG tablet Take 1 tablet (15 mg total) by mouth daily. 01/24/16   Cuthriell, Delorise RoyalsJonathan D, PA-C    Allergies Penicillins  No family history on file.  Social History Social History   Tobacco Use  . Smoking status: Current Every Day Smoker    Types: Cigarettes  . Smokeless tobacco: Never Used  Substance Use Topics  . Alcohol use: No    Frequency: Never  . Drug use: No     Review of Systems  Constitutional: No fever/chills. Cardiovascular: No chest pain. Respiratory: No SOB. Gastrointestinal: No abdominal pain.  No nausea, no vomiting.  Musculoskeletal: Negative for musculoskeletal pain. Skin: Negative for lacerations, ecchymosis.   ____________________________________________   PHYSICAL EXAM:  VITAL SIGNS: ED Triage Vitals  Enc Vitals Group     BP 11/25/16 1128 115/69     Pulse Rate 11/25/16 1128 70      Resp 11/25/16 1128 17     Temp 11/25/16 1128 97.8 F (36.6 C)     Temp Source 11/25/16 1128 Oral     SpO2 11/25/16 1128 100 %     Weight 11/25/16 1129 130 lb (59 kg)     Height 11/25/16 1129 5\' 5"  (1.651 m)     Head Circumference --      Peak Flow --      Pain Score 11/25/16 1128 7     Pain Loc --      Pain Edu? --      Excl. in GC? --      Constitutional: Alert and oriented. Well appearing and in no acute distress. Eyes: Conjunctivae are normal. PERRL. EOMI. Head: Atraumatic. ENT:      Ears:      Nose: No congestion/rhinnorhea.      Mouth/Throat: Mucous membranes are moist.  Neck: No stridor.  Cardiovascular: Normal rate, regular rhythm.  Good peripheral circulation. Respiratory: Normal respiratory effort without tachypnea or retractions. Lungs CTAB. Good air entry to the bases with no decreased or absent breath sounds. Musculoskeletal: Full range of motion to all extremities. No gross deformities appreciated. Neurologic:  Normal speech and language. No gross focal neurologic deficits are appreciated.  Skin:  Skin is warm, dry and intact.  1 cm x 1 cm area of swelling and fluctuance with center scab.  5 cm of surrounding erythema.   ____________________________________________   LABS (all labs ordered are  listed, but only abnormal results are displayed)  Labs Reviewed - No data to display ____________________________________________  EKG   ____________________________________________  RADIOLOGY  No results found.  ____________________________________________    PROCEDURES  Procedure(s) performed:    Procedures  INCISION AND DRAINAGE Performed by: Enid DerryAshley Jakobie Henslee Consent: Verbal consent obtained. Risks and benefits: risks, benefits and alternatives were discussed Type: abscess  Body area: thigh 5  Anesthesia: local infiltration  Incision was made with a scalpel.  Local anesthetic: lidocaine 1 % without epinephrine  Anesthetic total: 3  ml  Complexity: complex Blunt dissection to break up loculations  Drainage: purulent  Drainage amount: 2 ccs  Packing material: 1/4 in iodoform gauze  Patient tolerance: Patient tolerated the procedure well with no immediate complications.    Medications  Tdap (BOOSTRIX) injection 0.5 mL (0.5 mLs Intramuscular Given 11/25/16 1313)  ibuprofen (ADVIL,MOTRIN) tablet 800 mg (800 mg Oral Given 11/25/16 1313)  lidocaine (PF) (XYLOCAINE) 1 % injection 5 mL (5 mLs Intradermal Given 11/25/16 1313)  clindamycin (CLEOCIN) injection 600 mg (600 mg Intramuscular Given 11/25/16 1345)     ____________________________________________   INITIAL IMPRESSION / ASSESSMENT AND PLAN / ED COURSE  Pertinent labs & imaging results that were available during my care of the patient were reviewed by me and considered in my medical decision making (see chart for details).  Review of the [place] CSRS was performed in accordance of the [facility] prior to dispensing any controlled drugs.   Patient's diagnosis is consistent with abscess.  Vital signs and exam are reassuring.  Abscess was drained in ED.  Skin pen was used to mark area of cellulitis.  Patient was instructed to return if area extends past drawn line. Tetanus shot was updated.  IM clindamycin was given.  Patient will be discharged home with prescriptions for clindamycin. Patient is to follow up with PCP as directed. Patient is given ED precautions to return to the ED for any worsening or new symptoms.     ____________________________________________  FINAL CLINICAL IMPRESSION(S) / ED DIAGNOSES  Final diagnoses:  Abscess  Cellulitis of left lower extremity      NEW MEDICATIONS STARTED DURING THIS VISIT:  This SmartLink is deprecated. Use AVSMEDLIST instead to display the medication list for a patient.      This chart was dictated using voice recognition software/Dragon. Despite best efforts to proofread, errors can occur which can change the  meaning. Any change was purely unintentional.    Enid DerryWagner, Nyonna Hargrove, PA-C 11/25/16 1535    Sharyn CreamerQuale, Mark, MD 11/27/16 [phone]

## 2018-01-21 ENCOUNTER — Other Ambulatory Visit: Payer: Self-pay

## 2018-01-21 ENCOUNTER — Emergency Department
Admission: EM | Admit: 2018-01-21 | Discharge: 2018-01-21 | Disposition: A | Discharge status: Home / Self Care | Payer: Medicaid Other | Attending: Emergency Medicine | Admitting: Emergency Medicine

## 2018-01-21 ENCOUNTER — Encounter: Payer: Self-pay | Admitting: *Deleted

## 2018-01-21 DIAGNOSIS — F17210 Nicotine dependence, cigarettes, uncomplicated: Secondary | ICD-10-CM | POA: Insufficient documentation

## 2018-01-21 DIAGNOSIS — H9201 Otalgia, right ear: Secondary | ICD-10-CM | POA: Diagnosis present

## 2018-01-21 DIAGNOSIS — Z79899 Other long term (current) drug therapy: Secondary | ICD-10-CM | POA: Insufficient documentation

## 2018-01-21 DIAGNOSIS — H60391 Other infective otitis externa, right ear: Secondary | ICD-10-CM

## 2018-01-21 MED ORDER — NEOMYCIN-POLYMYXIN-HC 3.5-10000-1 OT SOLN: 3.0000 [drp] | Freq: Three times a day (TID) | OTIC | 0 refills | Status: AC

## 2018-01-21 MED ORDER — NEOMYCIN-POLYMYXIN-HC 3.5-10000-1 OT SOLN
4.0000 [drp] | Freq: Once | OTIC | Status: AC
  Administered 2018-01-21: 4 [drp] via OTIC
  Filled 2018-01-21: qty 10

## 2018-01-21 NOTE — ED Triage Notes (Signed)
Pt reports right ear ache for 3 weeks.  Pt reports drainage from ear.  Pt alert

## 2018-01-21 NOTE — ED Provider Notes (Signed)
[facility] Emergency Department Provider Note  ____________________________________________  Time seen: Approximately 10:53 PM  I have reviewed the triage vital signs and the nursing notes.   HISTORY  Chief Complaint Otalgia    HPI Brianna Holmes is a 38 y.o. female who presents the emergency department complaining of right ear pain and drainage.  Patient reports that she had URI symptoms approximately 3 weeks ago.  Most of her symptoms resolved with the exception of right ear pain.  Patient reports that there is pain while palpating the external ear and tragus.  She has endorsed drainage from the ear as well.  Patient has endorsed some hearing loss as well.  She denies any fevers or chills, nasal congestion, headaches, sore throat, coughing.  No history of swimming, trauma to the right ear.  She denies Q-tip use.    Past Medical History:  Diagnosis Date  . Seizures (HCC)     There are no active problems to display for this patient.   Past Surgical History:  Procedure Laterality Date  . TONSILLECTOMY      Prior to Admission medications   Medication Sig Start Date End Date Taking? Authorizing Provider  meloxicam (MOBIC) 15 MG tablet Take 1 tablet (15 mg total) by mouth daily. 01/24/16   Cuthriell, Delorise Royals, PA-C  neomycin-polymyxin-hydrocortisone (CORTISPORIN) OTIC solution Place 3 drops into the right ear 3 (three) times daily for 10 days. 01/21/18 01/31/18  Cuthriell, Delorise Royals, PA-C    Allergies Penicillins  No family history on file.  Social History Social History   Tobacco Use  . Smoking status: Current Every Day Smoker    Types: Cigarettes  . Smokeless tobacco: Never Used  Substance Use Topics  . Alcohol use: No    Frequency: Never  . Drug use: No     Review of Systems  Constitutional: No fever/chills Eyes: No visual changes. No discharge ENT: Positive for right ear pain, drainage from the right ear. Cardiovascular: no chest  pain. Respiratory: no cough. No SOB. Gastrointestinal: No abdominal pain.  No nausea, no vomiting.   Musculoskeletal: Negative for musculoskeletal pain. Skin: Negative for rash, abrasions, lacerations, ecchymosis. Neurological: Negative for headaches, focal weakness or numbness. 10-point ROS otherwise negative.  ____________________________________________   PHYSICAL EXAM:  VITAL SIGNS: ED Triage Vitals  Enc Vitals Group     BP 01/21/18 2212 (!) 151/99     Pulse Rate 01/21/18 2212 94     Resp 01/21/18 2212 20     Temp 01/21/18 2212 98.7 F (37.1 C)     Temp Source 01/21/18 2212 Oral     SpO2 01/21/18 2212 99 %     Weight 01/21/18 2213 140 lb (63.5 kg)     Height 01/21/18 2213 5\' 9"  (1.753 m)     Head Circumference --      Peak Flow --      Pain Score 01/21/18 2213 3     Pain Loc --      Pain Edu? --      Excl. in GC? --      Constitutional: Alert and oriented. Well appearing and in no acute distress. Eyes: Conjunctivae are normal. PERRL. EOMI. Head: Atraumatic. ENT:      Ears: TM and EAC on left is unremarkable.  Visualization of EAC on right reveals mild erythema, mild edema.  TM is visualized with no bulging, landmarks visible.  Good light reflex.  No visualization of perforation.  Patient is tender to palpation of the  tragus.  No apparent hearing loss on exam.      Nose: No congestion/rhinnorhea.      Mouth/Throat: Mucous membranes are moist.  Neck: No stridor.    Cardiovascular: Normal rate, regular rhythm. Normal S1 and S2.  Good peripheral circulation. Respiratory: Normal respiratory effort without tachypnea or retractions. Lungs CTAB. Good air entry to the bases with no decreased or absent breath sounds. Musculoskeletal: Full range of motion to all extremities. No gross deformities appreciated. Neurologic:  Normal speech and language. No gross focal neurologic deficits are appreciated.  Skin:  Skin is warm, dry and intact. No rash noted. Psychiatric: Mood and  affect are normal. Speech and behavior are normal. Patient exhibits appropriate insight and judgement.   ____________________________________________   LABS (all labs ordered are listed, but only abnormal results are displayed)  Labs Reviewed - No data to display ____________________________________________  EKG   ____________________________________________  RADIOLOGY   No results found.  ____________________________________________    PROCEDURES  Procedure(s) performed:    Procedures    Medications  NEOMYCIN-POLYMYXIN-HYDROCORTISONE (CORTISPORIN) OTIC (EAR) solution 4 drop (has no administration in time range)     ____________________________________________   INITIAL IMPRESSION / ASSESSMENT AND PLAN / ED COURSE  Pertinent labs & imaging results that were available during my care of the patient were reviewed by me and considered in my medical decision making (see chart for details).  Review of the [place] CSRS was performed in accordance of the [facility] prior to dispensing any controlled drugs.      Patient's diagnosis is consistent with otitis externa to the right ear.  Patient presents emergency department with right ear pain, drainage.  Visualization reveals mild otitis externa.  No evidence of otitis media.  Exam was otherwise reassuring.  Cortisporin drops provided to patient.  Follow-up with ENT if no improvement.. Patient is given ED precautions to return to the ED for any worsening or new symptoms.     ____________________________________________  FINAL CLINICAL IMPRESSION(S) / ED DIAGNOSES  Final diagnoses:  Other infective acute otitis externa of right ear      NEW MEDICATIONS STARTED DURING THIS VISIT:  ED Discharge Orders         Ordered    neomycin-polymyxin-hydrocortisone (CORTISPORIN) OTIC solution  3 times daily     01/21/18 2311              This chart was dictated using voice recognition software/Dragon. Despite best efforts  to proofread, errors can occur which can change the meaning. Any change was purely unintentional.    Racheal PatchesCuthriell, Jonathan D, PA-C 01/21/18 2312    Phineas SemenGoodman, Graydon, MD 01/21/18 2328

## 2018-01-21 NOTE — ED Notes (Signed)
Awaiting ear drops from pharmacy for discharge.

## 2020-08-18 ENCOUNTER — Emergency Department
Admission: EM | Admit: 2020-08-18 | Discharge: 2020-08-18 | Disposition: A | Discharge status: Home / Self Care | Payer: Medicaid Other | Attending: Emergency Medicine | Admitting: Emergency Medicine

## 2020-08-18 ENCOUNTER — Other Ambulatory Visit: Payer: Self-pay

## 2020-08-18 ENCOUNTER — Emergency Department: Payer: Medicaid Other

## 2020-08-18 DIAGNOSIS — R1031 Right lower quadrant pain: Secondary | ICD-10-CM | POA: Insufficient documentation

## 2020-08-18 DIAGNOSIS — F17210 Nicotine dependence, cigarettes, uncomplicated: Secondary | ICD-10-CM | POA: Diagnosis not present

## 2020-08-18 DIAGNOSIS — R110 Nausea: Secondary | ICD-10-CM | POA: Insufficient documentation

## 2020-08-18 DIAGNOSIS — R109 Unspecified abdominal pain: Secondary | ICD-10-CM

## 2020-08-18 LAB — COMPREHENSIVE METABOLIC PANEL
ALT: 11 U/L (ref 0–44)
AST: 9 U/L — ABNORMAL LOW (ref 15–41)
Albumin: 3.8 g/dL (ref 3.5–5.0)
Alkaline Phosphatase: 82 U/L (ref 38–126)
Anion gap: 8 (ref 5–15)
BUN: 12 mg/dL (ref 6–20)
CO2: 29 mmol/L (ref 22–32)
Calcium: 9.3 mg/dL (ref 8.9–10.3)
Chloride: 101 mmol/L (ref 98–111)
Creatinine, Ser: 0.74 mg/dL (ref 0.44–1.00)
GFR, Estimated: >60 mL/min (ref >60)
Glucose, Bld: 104 mg/dL — ABNORMAL HIGH (ref 70–99)
Potassium: 4.1 mmol/L (ref 3.5–5.1)
Sodium: 138 mmol/L (ref 135–145)
Total Bilirubin: 0.4 mg/dL (ref 0.3–1.2)
Total Protein: 8.3 g/dL — ABNORMAL HIGH (ref 6.5–8.1)

## 2020-08-18 LAB — URINALYSIS, COMPLETE (UACMP) WITH MICROSCOPIC
Bacteria, UA: NONE SEEN
Bilirubin Urine: NEGATIVE
Glucose, UA: NEGATIVE mg/dL
Hgb urine dipstick: NEGATIVE
Ketones, ur: NEGATIVE mg/dL
Leukocytes,Ua: NEGATIVE
Nitrite: NEGATIVE
Protein, ur: NEGATIVE mg/dL
Specific Gravity, Urine: 1.012 (ref 1.005–1.030)
pH: 5 (ref 5.0–8.0)

## 2020-08-18 LAB — CBC
HCT: 43.9 % (ref 36.0–46.0)
Hemoglobin: 13.9 g/dL (ref 12.0–15.0)
MCH: 29.6 pg (ref 26.0–34.0)
MCHC: 31.7 g/dL (ref 30.0–36.0)
MCV: 93.4 fL (ref 80.0–100.0)
Platelets: 432 10*3/uL — ABNORMAL HIGH (ref 150–400)
RBC: 4.7 MIL/uL (ref 3.87–5.11)
RDW: 12.5 % (ref 11.5–15.5)
WBC: 9.1 10*3/uL (ref 4.0–10.5)
nRBC: 0 % (ref 0.0–0.2)

## 2020-08-18 LAB — PREGNANCY, URINE: Preg Test, Ur: NEGATIVE

## 2020-08-18 LAB — LIPASE, BLOOD: Lipase: 36 U/L (ref 11–51)

## 2020-08-18 MED ORDER — MORPHINE SULFATE (PF) 4 MG/ML IV SOLN
4.0000 mg | Freq: Once | INTRAVENOUS | Status: AC
  Administered 2020-08-18: 4 mg via INTRAVENOUS
  Filled 2020-08-18: qty 1

## 2020-08-18 MED ORDER — ONDANSETRON HCL 4 MG/2ML IJ SOLN
4.0000 mg | Freq: Once | INTRAMUSCULAR | Status: AC
  Administered 2020-08-18: 4 mg via INTRAVENOUS
  Filled 2020-08-18: qty 2

## 2020-08-18 MED ORDER — IBUPROFEN 600 MG PO TABS: 600.0000 mg | ORAL_TABLET | Freq: Three times a day (TID) | ORAL | 0 refills | Status: AC

## 2020-08-18 MED ORDER — IOHEXOL 350 MG/ML SOLN
100.0000 mL | Freq: Once | INTRAVENOUS | Status: AC | PRN
  Administered 2020-08-18: 100 mL via INTRAVENOUS
  Filled 2020-08-18: qty 100

## 2020-08-18 MED ORDER — LIDOCAINE 5 % EX PTCH: 1.0000 | MEDICATED_PATCH | Freq: Two times a day (BID) | CUTANEOUS | 0 refills | Status: AC

## 2020-08-18 MED ORDER — TRAMADOL HCL 50 MG PO TABS: 50.0000 mg | ORAL_TABLET | Freq: Four times a day (QID) | ORAL | 0 refills | Status: AC | PRN

## 2020-08-18 NOTE — ED Triage Notes (Signed)
Pt here with right side abd pain that started a month ago but has gotten worse over time. Pt has nausea but denies V/D. Pt having normal bowel movements. PT still has her appendix.

## 2020-08-18 NOTE — ED Provider Notes (Signed)
[facility] Emergency Department Provider Note  ____________________________________________   Event Date/Time   First MD Initiated Contact with Patient 08/18/20 1535     (approximate)  I have reviewed the triage vital signs and the nursing notes.   HISTORY  Chief Complaint Abdominal Pain    HPI Collier FlowersJessica R Holmes is a 40 y.o. female with past medical history of seizures here with right-sided abdominal pain.  The patient states that off-and-on for the last 3 weeks, she has had progressively worsening aching, gnawing, right lower quadrant abdominal pain.  She states that it has been fairly intermittent but has been constant for the last several days.  She describes it as an aching, gnawing, but also burning sensation.  Is worse with any kind of movement or palpation.  She has had some associated nausea.  No change in her bowel habits.  No other complaints.    Past Medical History:  Diagnosis Date   Seizures (HCC)     There are no problems to display for this patient.   Past Surgical History:  Procedure Laterality Date   TONSILLECTOMY      Prior to Admission medications   Medication Sig Start Date End Date Taking? Authorizing Provider  ibuprofen (ADVIL) 600 MG tablet Take 1 tablet (600 mg total) by mouth 3 (three) times daily for 5 days. 08/18/20 08/23/20 Yes Shaune PollackIsaacs, Liliani Bobo, MD  lidocaine (LIDODERM) 5 % Place 1 patch onto the skin every 12 (twelve) hours. Remove & Discard patch within 12 hours or as directed by MD 08/18/20 08/18/21 Yes Shaune PollackIsaacs, Lacretia Tindall, MD  traMADol (ULTRAM) 50 MG tablet Take 1 tablet (50 mg total) by mouth every 6 (six) hours as needed for severe pain. 08/18/20 08/18/21 Yes Shaune PollackIsaacs, Kerem Gilmer, MD    Allergies Penicillins  No family history on file.  Social History Social History   Tobacco Use   Smoking status: Every Day    Types: Cigarettes   Smokeless tobacco: Never  Substance Use Topics   Alcohol use: No   Drug use: No     Review of Systems  Review of Systems  Constitutional:  Positive for fatigue. Negative for fever.  HENT:  Negative for congestion and sore throat.   Eyes:  Negative for visual disturbance.  Respiratory:  Negative for cough and shortness of breath.   Cardiovascular:  Negative for chest pain.  Gastrointestinal:  Positive for abdominal pain and nausea. Negative for diarrhea and vomiting.  Genitourinary:  Negative for flank pain.  Musculoskeletal:  Negative for back pain and neck pain.  Skin:  Negative for rash and wound.  Neurological:  Negative for weakness.    ____________________________________________  PHYSICAL EXAM:      VITAL SIGNS: ED Triage Vitals  Enc Vitals Group     BP 08/18/20 1247 107/84     Pulse Rate 08/18/20 1247 83     Resp 08/18/20 1247 18     Temp 08/18/20 1247 98.5 F (36.9 C)     Temp Source 08/18/20 1247 Oral     SpO2 08/18/20 1247 100 %     Weight 08/18/20 1248 152 lb (68.9 kg)     Height 08/18/20 1248 5\' 4"  (1.626 m)     Head Circumference --      Peak Flow --      Pain Score 08/18/20 1247 8     Pain Loc --      Pain Edu? --      Excl. in GC? --  Physical Exam Vitals and nursing note reviewed.  Constitutional:      General: She is not in acute distress.    Appearance: She is well-developed.  HENT:     Head: Normocephalic and atraumatic.  Eyes:     Conjunctiva/sclera: Conjunctivae normal.  Cardiovascular:     Rate and Rhythm: Normal rate and regular rhythm.     Heart sounds: Normal heart sounds. No murmur heard.   No friction rub.  Pulmonary:     Effort: Pulmonary effort is normal. No respiratory distress.     Breath sounds: Normal breath sounds. No wheezing or rales.  Abdominal:     General: There is no distension.     Palpations: Abdomen is soft.     Tenderness: There is abdominal tenderness in the right lower quadrant and suprapubic area. There is no guarding or rebound.  Musculoskeletal:     Cervical back: Neck supple.   Skin:    General: Skin is warm.     Capillary Refill: Capillary refill takes less than 2 seconds.  Neurological:     Mental Status: She is alert and oriented to person, place, and time.     Motor: No abnormal muscle tone.      ____________________________________________   LABS (all labs ordered are listed, but only abnormal results are displayed)  Labs Reviewed  COMPREHENSIVE METABOLIC PANEL - Abnormal; Notable for the following components:      Result Value   Glucose, Bld 104 (*)    Total Protein 8.3 (*)    AST 9 (*)    All other components within normal limits  CBC - Abnormal; Notable for the following components:   Platelets 432 (*)    All other components within normal limits  URINALYSIS, COMPLETE (UACMP) WITH MICROSCOPIC - Abnormal; Notable for the following components:   Color, Urine YELLOW (*)    APPearance HAZY (*)    All other components within normal limits  LIPASE, BLOOD  PREGNANCY, URINE  POC URINE PREG, ED    ____________________________________________  EKG:  ________________________________________  RADIOLOGY All imaging, including plain films, CT scans, and ultrasounds, independently reviewed by me, and interpretations confirmed via formal radiology reads.  ED MD interpretation:   CT Ap: No acute abnormality  Official radiology report(s): CT ABDOMEN PELVIS W CONTRAST  Result Date: 08/18/2020 CLINICAL DATA:  Right lower quadrant abdominal pain. EXAM: CT ABDOMEN AND PELVIS WITH CONTRAST TECHNIQUE: Multidetector CT imaging of the abdomen and pelvis was performed using the standard protocol following bolus administration of intravenous contrast. CONTRAST:  OMNIPAQUE IOHEXOL 350 MG/ML SOLN COMPARISON:  Renal ultrasound April 15, 2012 FINDINGS: Lower chest: Hypoventilatory change in the lung bases. Hepatobiliary: Hepatomegaly with diffuse hepatic steatosis. There are 3 hyperenhancing hepatic lesions in the right lobe of the liver for instance in the  posterior right lobe of the liver measuring 1 cm on image 10/3, in the hepatic dome measuring 1 cm on image 6/3 and in the medial right lobe of the liver 1.2 cm on image 8/3, possibly representing benign hepatic hemangiomas but incompletely characterized on this single phase study. Gallbladder is unremarkable. No biliary ductal dilation. Pancreas: Within normal limits. Spleen: Within normal limits. Adrenals/Urinary Tract: Bilateral adrenal glands are unremarkable. No hydronephrosis. Hypodense left renal lesions measuring up to 1 cm which are too small to accurately characterize but statistically likely to represent cysts. No solid enhancing hepatic lesion. Stomach/Bowel: Stomach is within normal limits. Appendix appears normal. No evidence of bowel wall thickening, distention, or inflammatory  changes. Vascular/Lymphatic: No significant vascular findings are present. No pathologically enlarged abdominal or pelvic lymph nodes. Reproductive: Uterus and bilateral adnexa are unremarkable. Other: No abdominopelvic ascites. No pneumoperitoneum. No portal venous gas. Musculoskeletal: No acute or significant osseous findings. IMPRESSION: 1. No acute findings in the abdomen or pelvis. Normal appendix. 2. Hepatomegaly with diffuse hepatic steatosis. 3. 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 Consider better characterization of these lesions with nonemergent MRI of the abdomen with and without contrast. 4. Hypodense left renal lesions measuring up to 1 cm which are too small to accurately characterize but statistically likely to represent cysts. Electronically Signed   By: Maudry Mayhew MD   On: 08/18/2020 19:02    ____________________________________________  PROCEDURES   Procedure(s) performed (including Critical  Care):  Procedures  ____________________________________________  INITIAL IMPRESSION / MDM / ASSESSMENT AND PLAN / ED COURSE  As part of my medical decision making, I reviewed the following data within the electronic MEDICAL RECORD NUMBER Nursing notes reviewed and incorporated, Old chart reviewed, Notes from prior ED visits, and [place] Controlled Substance Database       *REGANA KEMPLE was evaluated in Emergency Department on 08/18/2020 for the symptoms described in the history of present illness. She was evaluated in the context of the global COVID-19 pandemic, which necessitated consideration that the patient might be at risk for infection with the SARS-CoV-2 virus that causes COVID-19. Institutional protocols and algorithms that pertain to the evaluation of patients at risk for COVID-19 are in a state of rapid change based on information released by regulatory bodies including the CDC and federal and state organizations. These policies and algorithms were followed during the patient's care in the ED.  Some ED evaluations and interventions may be delayed as a result of limited staffing during the pandemic.*     Medical Decision Making: 40 year old female here with right-sided abdominal pain.  Patient is significantly tender in the right lower quadrant, even with some mild allodynia raising question of possible neuropathic pain.  No evidence of zoster.  Given her tenderness, CT scan obtained and reviewed as above.  Patient has no evidence of appendicitis or other acute intra-abdominal pathology.  She has some cysts and possible hemangiomas noted for which she will follow-up as an outpatient.  Otherwise, CBC without leukocytosis.  CMP unremarkable.  Lipase normal.  UA without signs of UTI.  She feels better with analgesia in the ED.  Will treat for possible musculoskeletal/abdominal wall pain, advise trial of Lidoderm patches, and outpatient  follow-up.  ____________________________________________  FINAL CLINICAL IMPRESSION(S) / ED DIAGNOSES  Final diagnoses:  Right lower quadrant abdominal pain  Abdominal wall pain     MEDICATIONS GIVEN DURING THIS VISIT:  Medications  morphine 4 MG/ML injection 4 mg (4 mg Intravenous Given 08/18/20 1626)  ondansetron (ZOFRAN) injection 4 mg (4 mg Intravenous Given 08/18/20 1626)  iohexol (OMNIPAQUE) 350 MG/ML injection 100 mL (100 mLs Intravenous Contrast Given 08/18/20 1816)     ED Discharge Orders          Ordered    ibuprofen (ADVIL) 600 MG tablet  3 times daily        08/18/20 1932    lidocaine (LIDODERM) 5 %  Every 12 hours        08/18/20 1932    traMADol (ULTRAM) 50 MG tablet  Every 6 hours PRN  08/18/20 1932             Note:  This document was prepared using Dragon voice recognition software and may include unintentional dictation errors.   Shaune Pollack, MD 08/18/20 1942

## 2022-09-28 ENCOUNTER — Ambulatory Visit: Payer: Medicaid Other

## 2022-10-08 ENCOUNTER — Ambulatory Visit: Payer: Medicaid Other | Admitting: Family Medicine

## 2022-10-08 DIAGNOSIS — Z975 Presence of (intrauterine) contraceptive device: Secondary | ICD-10-CM | POA: Insufficient documentation

## 2022-10-08 DIAGNOSIS — Z113 Encounter for screening for infections with a predominantly sexual mode of transmission: Secondary | ICD-10-CM

## 2022-10-08 DIAGNOSIS — Z202 Contact with and (suspected) exposure to infections with a predominantly sexual mode of transmission: Secondary | ICD-10-CM

## 2022-10-08 LAB — HEPATITIS B SURFACE ANTIGEN: Hepatitis B Surface Ag: NONREACTIVE

## 2022-10-08 LAB — WET PREP FOR TRICH, YEAST, CLUE
Trichomonas Exam: NEGATIVE
Yeast Exam: NEGATIVE

## 2022-10-08 LAB — HM HEPATITIS C SCREENING LAB: HM Hepatitis Screen: NEGATIVE

## 2022-10-08 LAB — HM HIV SCREENING LAB: HM HIV Screening: NEGATIVE

## 2022-10-08 MED ORDER — DOXYCYCLINE HYCLATE 100 MG PO TABS: 100.0000 mg | ORAL_TABLET | Freq: Two times a day (BID) | ORAL | Status: AC

## 2022-10-08 NOTE — Progress Notes (Signed)
Pt is here for std screening, wet prep results reviewed no treatment per standing order.  The patient was dispensed doxycyline today. I provided counseling today regarding the medication. We discussed the medication, the side effects and when to call clinic. Patient given the opportunity to ask questions. Questions answered.  Condoms given.  Gaspar Garbe, RN
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
## 2022-10-30 ENCOUNTER — Other Ambulatory Visit: Payer: Self-pay

## 2022-10-30 ENCOUNTER — Encounter: Payer: Self-pay | Admitting: Radiology

## 2022-10-30 ENCOUNTER — Emergency Department: Payer: Medicaid Other

## 2022-10-30 ENCOUNTER — Emergency Department
Admission: EM | Admit: 2022-10-30 | Discharge: 2022-10-30 | Disposition: A | Discharge status: Home / Self Care | Payer: Medicaid Other | Attending: Emergency Medicine | Admitting: Emergency Medicine

## 2022-10-30 DIAGNOSIS — S060X9A Concussion with loss of consciousness of unspecified duration, initial encounter: Secondary | ICD-10-CM | POA: Diagnosis not present

## 2022-10-30 DIAGNOSIS — S0990XA Unspecified injury of head, initial encounter: Secondary | ICD-10-CM | POA: Diagnosis present

## 2022-10-30 DIAGNOSIS — S0101XA Laceration without foreign body of scalp, initial encounter: Secondary | ICD-10-CM | POA: Insufficient documentation

## 2022-10-30 DIAGNOSIS — K7689 Other specified diseases of liver: Secondary | ICD-10-CM | POA: Insufficient documentation

## 2022-10-30 DIAGNOSIS — K769 Liver disease, unspecified: Secondary | ICD-10-CM

## 2022-10-30 LAB — TYPE AND SCREEN
ABO/RH(D): A POS
Antibody Screen: NEGATIVE

## 2022-10-30 LAB — COMPREHENSIVE METABOLIC PANEL
ALT: 12 U/L (ref 0–44)
AST: 9 U/L — ABNORMAL LOW (ref 15–41)
Albumin: 3.8 g/dL (ref 3.5–5.0)
Alkaline Phosphatase: 59 U/L (ref 38–126)
Anion gap: 9 (ref 5–15)
BUN: 13 mg/dL (ref 6–20)
CO2: 23 mmol/L (ref 22–32)
Calcium: 8.8 mg/dL — ABNORMAL LOW (ref 8.9–10.3)
Chloride: 106 mmol/L (ref 98–111)
Creatinine, Ser: 0.72 mg/dL (ref 0.44–1.00)
GFR, Estimated: >60 mL/min (ref >60)
Glucose, Bld: 110 mg/dL — ABNORMAL HIGH (ref 70–99)
Potassium: 3.3 mmol/L — ABNORMAL LOW (ref 3.5–5.1)
Sodium: 138 mmol/L (ref 135–145)
Total Bilirubin: 0.6 mg/dL (ref 0.3–1.2)
Total Protein: 7 g/dL (ref 6.5–8.1)

## 2022-10-30 LAB — URINALYSIS, ROUTINE W REFLEX MICROSCOPIC
Bilirubin Urine: NEGATIVE
Glucose, UA: NEGATIVE mg/dL
Ketones, ur: NEGATIVE mg/dL
Leukocytes,Ua: NEGATIVE
Nitrite: NEGATIVE
Protein, ur: NEGATIVE mg/dL
Specific Gravity, Urine: 1.019 (ref 1.005–1.030)
pH: 6 (ref 5.0–8.0)

## 2022-10-30 LAB — CBC
HCT: 41.6 % (ref 36.0–46.0)
Hemoglobin: 13.5 g/dL (ref 12.0–15.0)
MCH: 30.5 pg (ref 26.0–34.0)
MCHC: 32.5 g/dL (ref 30.0–36.0)
MCV: 94.1 fL (ref 80.0–100.0)
Platelets: 295 10*3/uL (ref 150–400)
RBC: 4.42 MIL/uL (ref 3.87–5.11)
RDW: 12.3 % (ref 11.5–15.5)
WBC: 13.6 10*3/uL — ABNORMAL HIGH (ref 4.0–10.5)
nRBC: 0 % (ref 0.0–0.2)

## 2022-10-30 LAB — URINE DRUG SCREEN, QUALITATIVE (ARMC ONLY)
Amphetamines, Ur Screen: POSITIVE — AB
Barbiturates, Ur Screen: NOT DETECTED
Benzodiazepine, Ur Scrn: NOT DETECTED
Cannabinoid 50 Ng, Ur ~~LOC~~: NOT DETECTED
Cocaine Metabolite,Ur ~~LOC~~: POSITIVE — AB
MDMA (Ecstasy)Ur Screen: NOT DETECTED
Methadone Scn, Ur: NOT DETECTED
Opiate, Ur Screen: NOT DETECTED
Phencyclidine (PCP) Ur S: NOT DETECTED
Tricyclic, Ur Screen: NOT DETECTED

## 2022-10-30 LAB — ETHANOL: Alcohol, Ethyl (B): <10 mg/dL (ref <10)

## 2022-10-30 LAB — POC URINE PREG, ED: Preg Test, Ur: NEGATIVE

## 2022-10-30 MED ORDER — OXYCODONE-ACETAMINOPHEN 5-325 MG PO TABS
1.0000 | ORAL_TABLET | Freq: Once | ORAL | Status: AC
  Administered 2022-10-30: 1 via ORAL
  Filled 2022-10-30: qty 1

## 2022-10-30 MED ORDER — IOHEXOL 300 MG/ML  SOLN
100.0000 mL | Freq: Once | INTRAMUSCULAR | Status: AC | PRN
  Administered 2022-10-30: 100 mL via INTRAVENOUS

## 2022-10-30 MED ORDER — LIDOCAINE HCL (PF) 1 % IJ SOLN
5.0000 mL | Freq: Once | INTRAMUSCULAR | Status: AC
  Administered 2022-10-30: 5 mL
  Filled 2022-10-30: qty 5

## 2022-10-30 NOTE — ED Notes (Signed)
Pt more alert and reporting jaw pain, neck pain and head pain. Pt anxious stating she can't breath and attempting to take c-collar off. This RN attempting to provided comfort measures and breathing techniques. Pt remains in c-collar pending CT results.
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
## 2022-10-30 NOTE — ED Notes (Signed)
Pt assisted onto bedpan.
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
## 2022-10-30 NOTE — ED Notes (Signed)
POC pregnancy negative
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
## 2022-10-30 NOTE — Discharge Instructions (Addendum)
As we discussed please follow up with your primary care about obtaining an MRI of your liver to further evaluate the lesion seen today. Your staples should be removed in 7-10 days. Please seek medical attention for any high fevers, chest pain, shortness of breath, change in behavior, persistent vomiting, bloody stool or any other new or concerning symptoms.

## 2022-10-30 NOTE — ED Notes (Signed)
Pt taken to CT by this RN.  ?

## 2022-10-30 NOTE — ED Notes (Signed)
RN on phone with CT and will bring pt to CT 3

## 2022-10-30 NOTE — ED Notes (Signed)
Family at bedside. 

## 2023-06-17 ENCOUNTER — Ambulatory Visit

## 2023-06-18 ENCOUNTER — Ambulatory Visit

## 2023-06-21 ENCOUNTER — Ambulatory Visit

## 2023-06-25 ENCOUNTER — Ambulatory Visit

## 2023-07-10 ENCOUNTER — Ambulatory Visit

## 2023-07-11 ENCOUNTER — Ambulatory Visit

## 2023-07-16 ENCOUNTER — Ambulatory Visit

## 2023-10-10 ENCOUNTER — Ambulatory Visit

## 2023-10-10 DIAGNOSIS — Z202 Contact with and (suspected) exposure to infections with a predominantly sexual mode of transmission: Secondary | ICD-10-CM

## 2023-10-10 DIAGNOSIS — Z113 Encounter for screening for infections with a predominantly sexual mode of transmission: Secondary | ICD-10-CM

## 2023-10-10 LAB — HM HIV SCREENING LAB: HM HIV Screening: NEGATIVE

## 2023-10-10 MED ORDER — DOXYCYCLINE HYCLATE 100 MG PO TABS: 100.0000 mg | ORAL_TABLET | Freq: Two times a day (BID) | ORAL | Status: AC

## 2023-10-10 NOTE — Progress Notes (Signed)
 Contact to Syphilis and here treatment. Pt was dispensed Doxycycline  100 mg 2x/day for 14 days. I provided counseling today regarding the medication. We discussed the medication, the side effects and when to call clinic. Patient given the opportunity to ask questions for any clarifications. Questions answered. Brochure and condoms given. Wilkie Drought, RN.

## 2023-10-10 NOTE — Progress Notes (Signed)
 Not seen by provider today. She did not want full testing or to consult NP, desired treatment as syphilis contact and HIV/RPR testing. Orders placed.

## 2023-10-12 LAB — SYPHILIS SEROLOGY, ~~LOC~~ LAB
RPR, Quant: 1:8 {titer}
RPR: REACTIVE

## 2023-10-24 ENCOUNTER — Ambulatory Visit: Payer: Self-pay | Admitting: Family Medicine

## 2023-10-24 ENCOUNTER — Telehealth: Payer: Self-pay

## 2023-10-24 DIAGNOSIS — Z8619 Personal history of other infectious and parasitic diseases: Secondary | ICD-10-CM | POA: Insufficient documentation

## 2023-10-24 NOTE — Telephone Encounter (Signed)
 Call pt re syphilis results and Dr. Francesco notes and order, no additional tx.

## 2023-10-24 NOTE — Progress Notes (Signed)
 Positive syphilis result reviewed. Visit on 10/10/23 was a nurse-only visit in which she obtained medication for syphilis contact. That titer was 1:8, previous infection on file thus trep ab not completed. Only other syphilis testing on file in Epic and Care Everywhere from 10/08/2022 - 1:32, treated as contact.   Syphilis contact treated appropriately 10/10/23 with doxycycline  100 mg BID x 14 days (given bicillin recalls and current shortage). Titer 1:8 is likely her baseline, will not need further treatment.   Dorothyann Helling, MD 10/24/23  9:05 AM

## 2023-10-24 NOTE — Telephone Encounter (Signed)
 Phone call to pt and pt confirmed password. Discussed 10/10/23 syphilis TR and provider findings, no additional tx needed. Pt expressed understanding.
